# Patient Record
Sex: Male | Born: 1995 | Race: Black or African American | Hispanic: No | Marital: Single
Health system: Southern US, Community
[De-identification: ages and names within clinical notes are randomized; demographics above are authoritative.]

---

## 2017-07-29 ENCOUNTER — Emergency Department (HOSPITAL_COMMUNITY)
Admission: EM | Admit: 2017-07-29 | Discharge: 2017-07-29 | Disposition: A | Discharge status: Home / Self Care | Payer: Federal, State, Local not specified - PPO | Attending: Emergency Medicine | Admitting: Emergency Medicine

## 2017-07-29 ENCOUNTER — Encounter (HOSPITAL_COMMUNITY): Payer: Self-pay | Admitting: Nurse Practitioner

## 2017-07-29 ENCOUNTER — Emergency Department (HOSPITAL_COMMUNITY): Payer: Federal, State, Local not specified - PPO

## 2017-07-29 DIAGNOSIS — Y929 Unspecified place or not applicable: Secondary | ICD-10-CM | POA: Insufficient documentation

## 2017-07-29 DIAGNOSIS — Y9389 Activity, other specified: Secondary | ICD-10-CM | POA: Diagnosis not present

## 2017-07-29 DIAGNOSIS — X500XXA Overexertion from strenuous movement or load, initial encounter: Secondary | ICD-10-CM | POA: Insufficient documentation

## 2017-07-29 DIAGNOSIS — Y999 Unspecified external cause status: Secondary | ICD-10-CM | POA: Diagnosis not present

## 2017-07-29 DIAGNOSIS — S93104A Unspecified dislocation of right toe(s), initial encounter: Secondary | ICD-10-CM | POA: Insufficient documentation

## 2017-07-29 DIAGNOSIS — S92421A Displaced fracture of distal phalanx of right great toe, initial encounter for closed fracture: Secondary | ICD-10-CM | POA: Diagnosis not present

## 2017-07-29 DIAGNOSIS — S99921A Unspecified injury of right foot, initial encounter: Secondary | ICD-10-CM | POA: Diagnosis present

## 2017-07-29 MED ORDER — NAPROXEN 500 MG PO TABS: 500.0000 mg | ORAL_TABLET | Freq: Two times a day (BID) | ORAL | 0 refills | Status: AC

## 2017-07-29 MED ORDER — IBUPROFEN 200 MG PO TABS
600.0000 mg | ORAL_TABLET | Freq: Once | ORAL | Status: AC
  Administered 2017-07-29: 600 mg via ORAL
  Filled 2017-07-29: qty 3

## 2017-07-29 MED ORDER — HYDROCODONE-ACETAMINOPHEN 5-325 MG PO TABS
1.0000 | ORAL_TABLET | Freq: Once | ORAL | Status: AC
  Administered 2017-07-29: 1 via ORAL
  Filled 2017-07-29: qty 1

## 2017-07-29 NOTE — ED Provider Notes (Signed)
[facility]-EMERGENCY DEPT Provider Note   CSN: [id number] Arrival date & time: 07/29/17  1546     History   Chief Complaint Chief Complaint  Patient presents with  . Toe Pain    HPI Carlos Lee is a 22 y.o. male who presents to the ED with right toe pain. Patient reports he injured the toe while trying to do a back flip. The injury is to the right great toe.   The history is provided by the patient. No language interpreter was used.  Toe Pain  This is a new problem. The current episode started 1 to 2 hours ago. The problem occurs constantly. The problem has not changed since onset.The symptoms are aggravated by walking. Nothing relieves the symptoms. He has tried nothing for the symptoms.    History reviewed. No pertinent past medical history.  There are no active problems to display for this patient.   History reviewed. No pertinent surgical history.      Home Medications    Prior to Admission medications   Medication Sig Start Date End Date Taking? Authorizing Provider  naproxen (NAPROSYN) 500 MG tablet Take 1 tablet (500 mg total) by mouth 2 (two) times daily. 07/29/17   Janne Napoleon, NP    Family History No family history on file.  Social History Social History   Tobacco Use  . Smoking status: Not on file  Substance Use Topics  . Alcohol use: Yes  . Drug use: Not on file     Allergies   Patient has no allergy information on record.   Review of Systems Review of Systems  Musculoskeletal: Positive for arthralgias.       Right great toe  All other systems reviewed and are negative.    Physical Exam Updated Vital Signs BP 127/74 (BP Location: Left Arm)   Pulse 67   Temp 98.4 F (36.9 C) (Oral)   Resp 16   Ht  (1.88 m)   Wt 104.3 kg (230 lb)   SpO2 98%   BMI 29.53 kg/m   Physical Exam  Constitutional: He appears well-developed and well-nourished. No distress.  HENT:  Head: Normocephalic.  Eyes: EOM  are normal.  Neck: Neck supple.  Cardiovascular: Normal rate and intact distal pulses.  Pulmonary/Chest: Effort normal.  Musculoskeletal:       Right foot: There is tenderness and deformity. There is normal capillary refill and no laceration.  Right great toe with deformity.   Neurological: He is alert.  Skin: Skin is warm and dry.  Nursing note and vitals reviewed.    ED Treatments / Results  Labs (all labs ordered are listed, but only abnormal results are displayed) Labs Reviewed - No data to display  EKG None  Radiology Dg Toe Great Right  Result Date: 07/29/2017 CLINICAL DATA:  Post reduction EXAM: RIGHT GREAT TOE COMPARISON:  Right first toe radiographs from earlier today FINDINGS: Successful reduction, with no residual malalignment. Tiny avulsion fracture fragment is noted at the lateral base of the distal phalanx in the right first toe. No additional fracture. No suspicious focal osseous lesion. No radiopaque foreign body. IMPRESSION: Successful reduction, with no residual malalignment. Tiny avulsion fracture fragment at the lateral base of the distal phalanx in the right first toe. Electronically Signed   By: Delbert Phenix M.D.   On: 07/29/2017 17:24   Dg Toe Great Right  Result Date: 07/29/2017 CLINICAL DATA:  Right first toe pain after injury. EXAM: RIGHT GREAT  TOE COMPARISON:  None. FINDINGS: There is noted dorsal dislocation of the first distal phalanx relative to proximal phalanx. No definite fracture is noted. No soft tissue abnormality is noted. IMPRESSION: Dorsal dislocation of first distal phalanx relative to proximal phalanx. Electronically Signed   By: Lupita Raider, M.D.   On: 07/29/2017 16:47    Procedures Reduction of dislocation Date/Time: 07/29/2017 5:00 PM Performed by: Janne Napoleon, NP Authorized by: Janne Napoleon, NP  Consent: Verbal consent obtained. Consent given by: patient Patient understanding: patient states understanding of the procedure being  performed Test results: test results available and properly labeled Imaging studies: imaging studies available Required items: required blood products, implants, devices, and special equipment available Patient identity confirmed: verbally with patient Preparation: Patient was prepped and draped in the usual sterile fashion. Local anesthesia used: no  Anesthesia: Local anesthesia used: no  Sedation: Patient sedated: no  Patient tolerance: Patient tolerated the procedure well with no immediate complications Comments: Reduction of dislocation of right great toe    (including critical care time)  Medications Ordered in ED Medications  HYDROcodone-acetaminophen (NORCO/VICODIN) 5-325 MG per tablet 1 tablet (1 tablet Oral Given 07/29/17 1755)  ibuprofen (ADVIL,MOTRIN) tablet 600 mg (600 mg Oral Given 07/29/17 1755)     Initial Impression / Assessment and Plan / ED Course  I have reviewed the triage vital signs and the nursing notes. 22 y.o. male here with dislocation of the right great s/p injury stable for d/c after successful reduction. There is a tiny avulsion fracture noted on post reduction film. Buddy tape, post op shoe, ice, elevation and f/u with ortho. Patient agrees with plan.  Final Clinical Impressions(s) / ED Diagnoses   Final diagnoses:  Dislocated toe, right, initial encounter  Closed non-physeal fracture of distal phalanx of right great toe, initial encounter    ED Discharge Orders        Ordered    naproxen (NAPROSYN) 500 MG tablet  2 times daily     07/29/17 1734       Damian Leavell [place], [place] 07/29/17 1800    Vanetta Mulders, MD 07/31/17 [phone]

## 2017-07-29 NOTE — ED Triage Notes (Signed)
Pt is c/o right big toe pain. States he injured it while trying to do a back flip.

## 2017-07-29 NOTE — Discharge Instructions (Signed)
Buddy tape the toes and wear the post op shoe. Follow up with Dr. Roda Shutters.

## 2019-02-11 IMAGING — CR DG TOE GREAT 2+V*R*
3 series · 3 of 3 positions shown · non-contrast
Comparison: None.

CLINICAL DATA: Right first toe pain after injury.

EXAM:
RIGHT GREAT TOE

[x toes ap right]
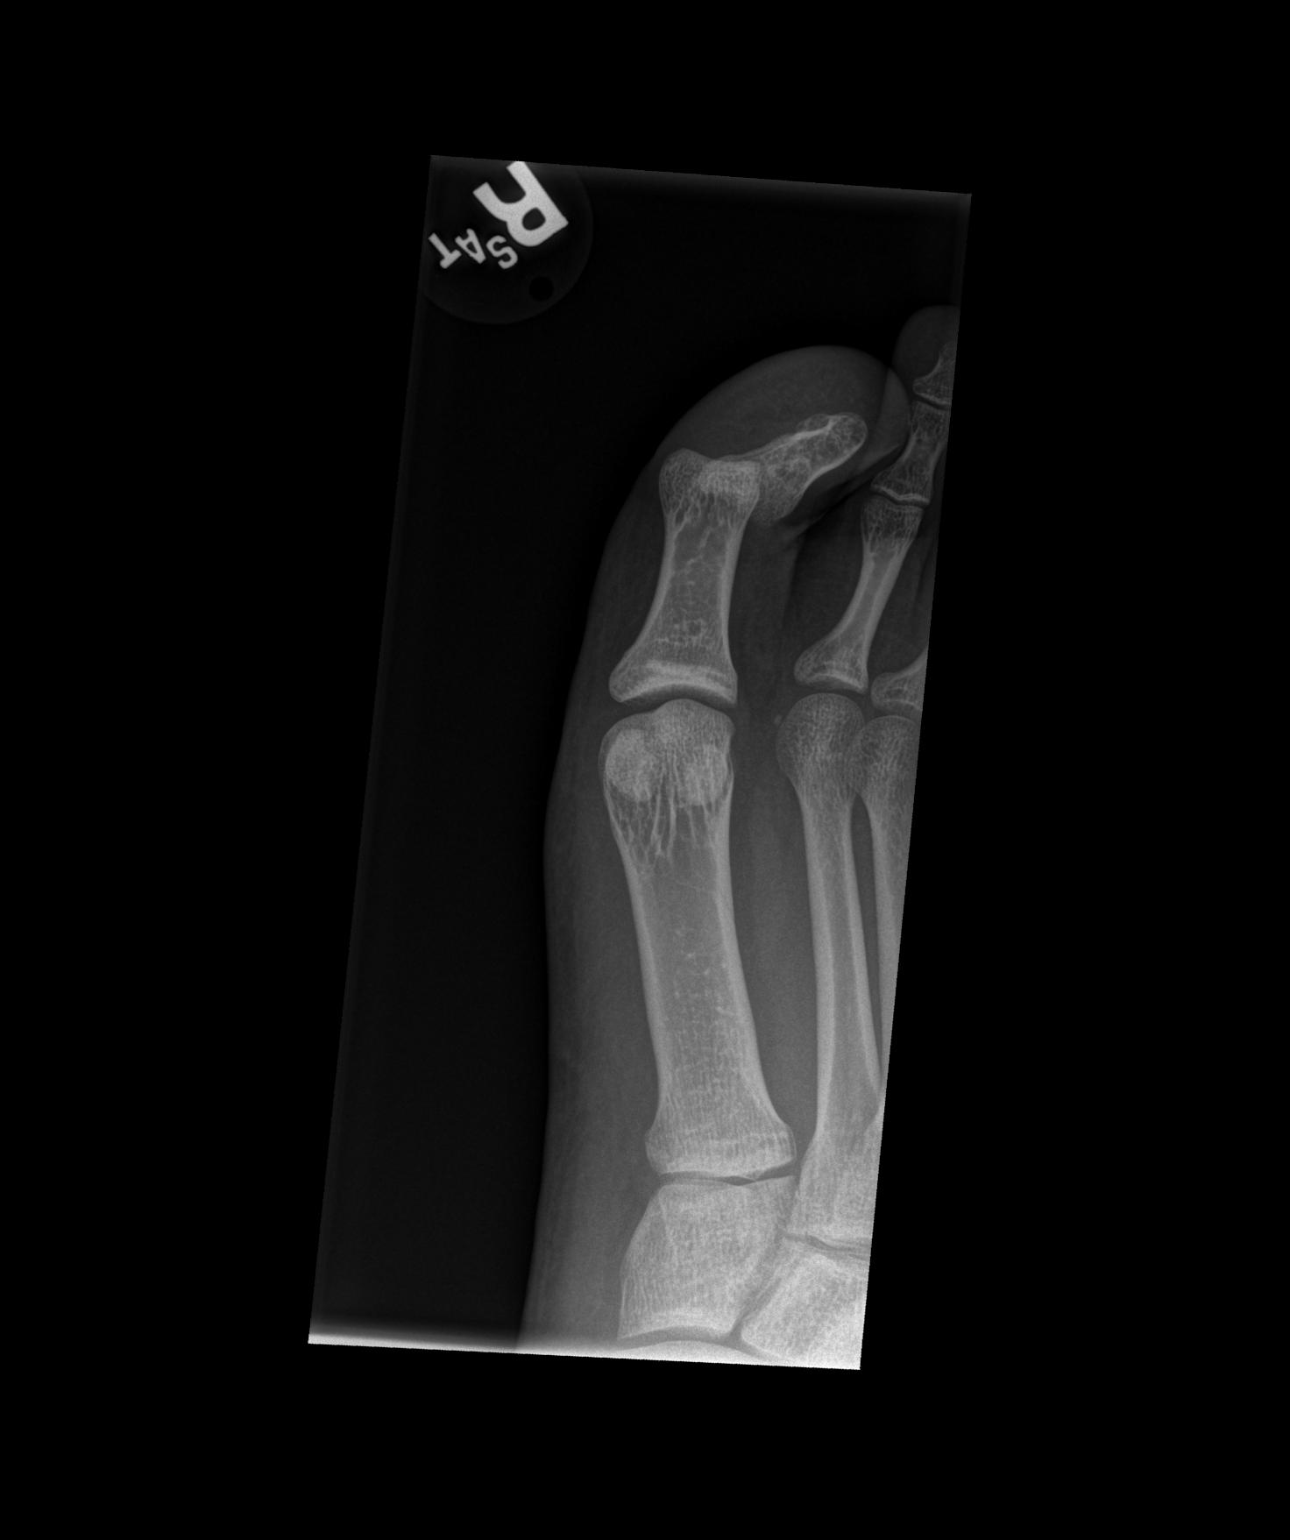

[x toes obl right]
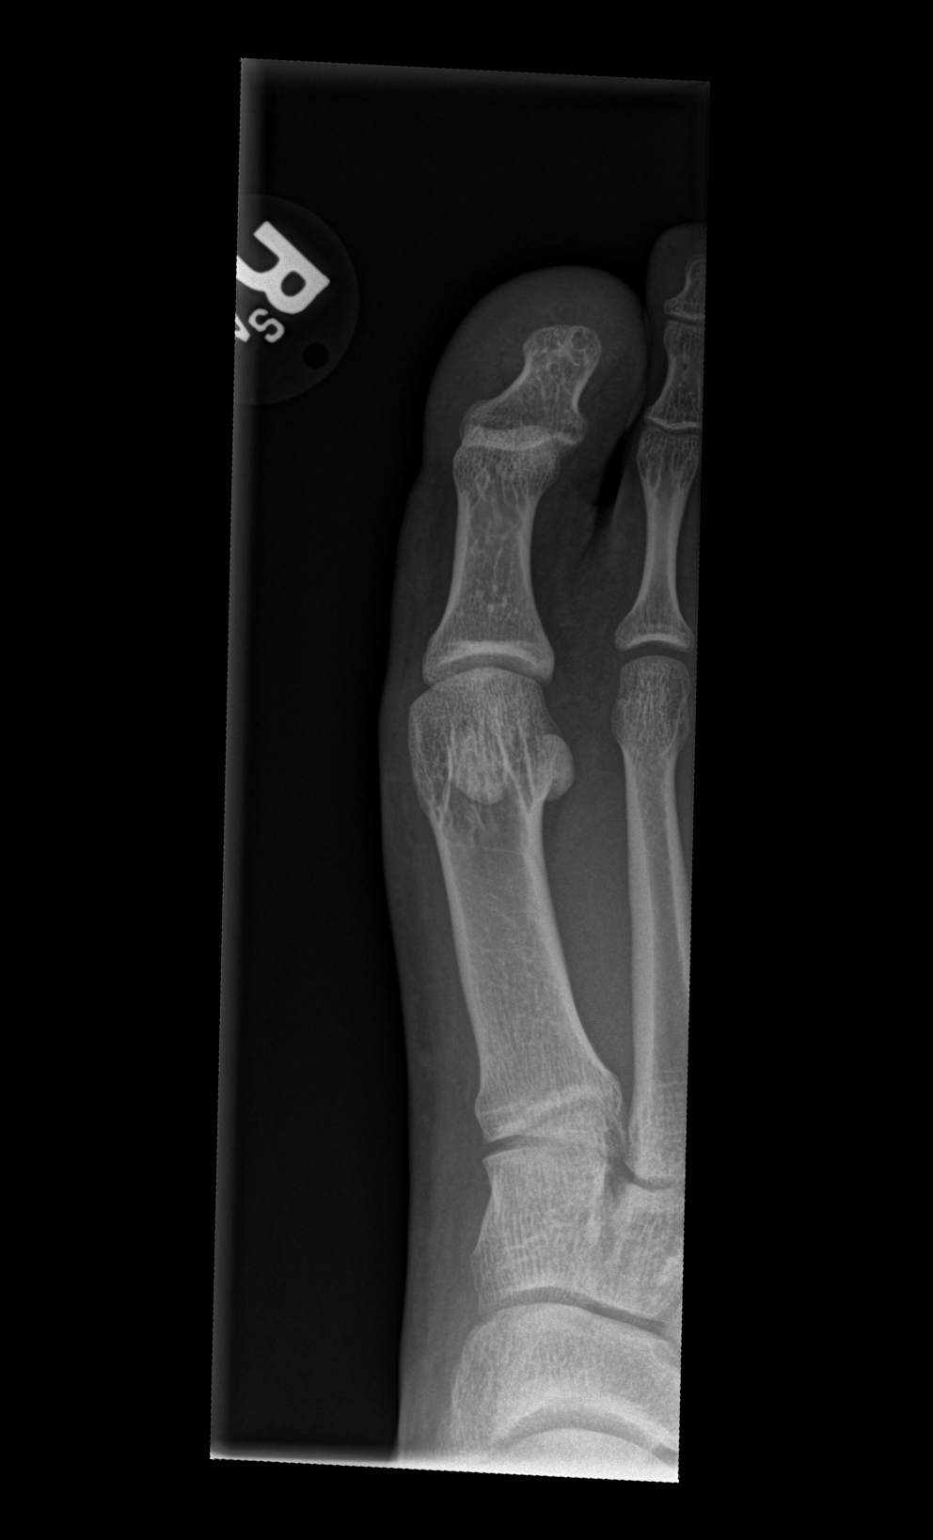

[x toes lat right]
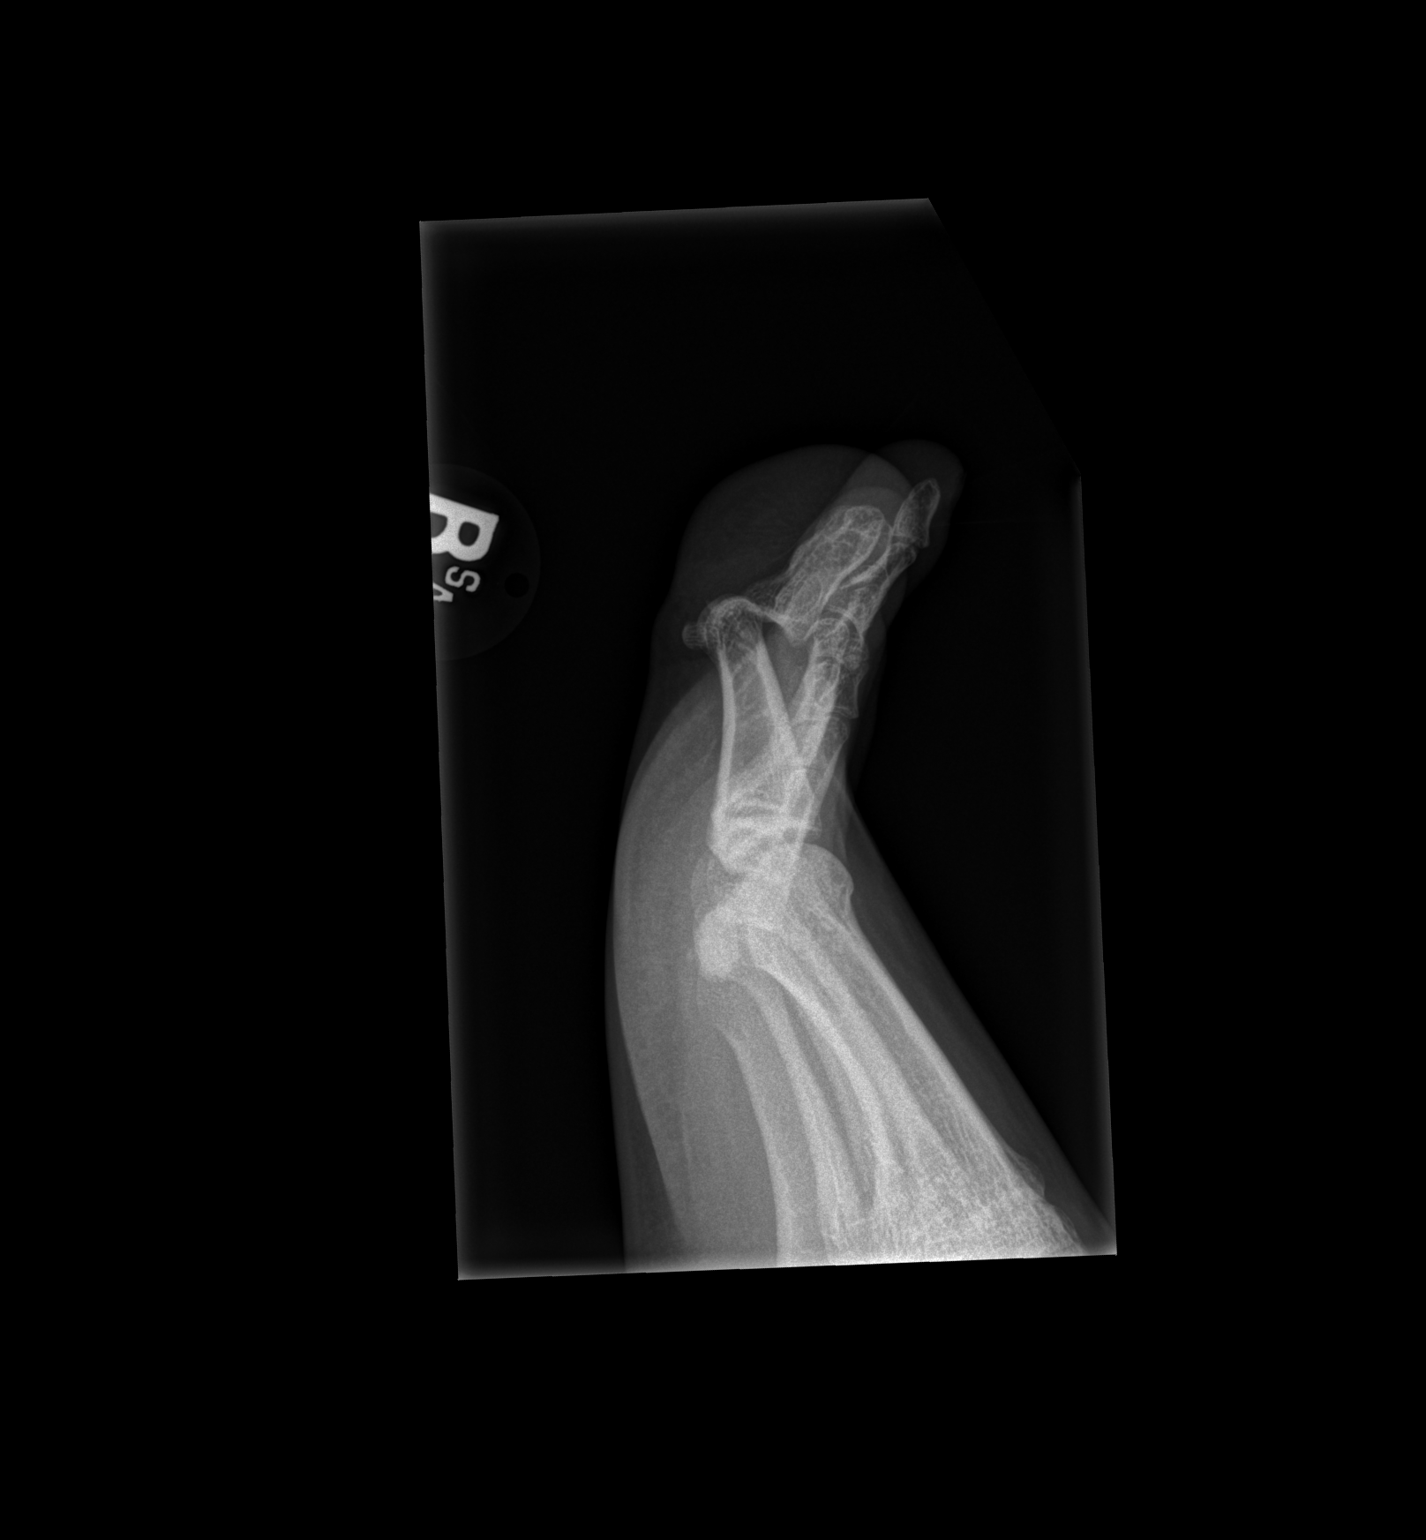

[3 of 3 positions shown; findings below may reference images not displayed]

FINDINGS: There is noted dorsal dislocation of the first distal phalanx
relative to proximal phalanx. No definite fracture is noted. No soft
tissue abnormality is noted.
IMPRESSION: Dorsal dislocation of first distal phalanx relative to proximal
phalanx.

## 2019-06-13 ENCOUNTER — Ambulatory Visit: Payer: Federal, State, Local not specified - PPO | Attending: Family

## 2019-06-13 DIAGNOSIS — Z23 Encounter for immunization: Secondary | ICD-10-CM

## 2019-06-13 NOTE — Progress Notes (Signed)
   Covid-19 Vaccination Clinic  Name:  Shervin Cypert    MRN: [id number] DOB: 16-Jun-1995  06/13/2019  Mr. Henson was observed post Covid-19 immunization for 15 minutes without incident. He was provided with Vaccine Information Sheet and instruction to access the V-Safe system.   Mr. Treinen was instructed to call 911 with any severe reactions post vaccine: Marland Kitchen Difficulty breathing  . Swelling of face and throat  . A fast heartbeat  . A bad rash all over body  . Dizziness and weakness   Immunizations Administered    Name Date Dose VIS Date Route   Moderna COVID-19 Vaccine 06/13/2019  3:51 PM 0.5 mL 02/26/2019 Intramuscular   Manufacturer: Moderna   Lot: [id number]   NDC: 18841-660-63

## 2019-07-09 ENCOUNTER — Ambulatory Visit: Payer: Federal, State, Local not specified - PPO | Attending: Family

## 2019-07-09 DIAGNOSIS — Z23 Encounter for immunization: Secondary | ICD-10-CM

## 2019-07-09 NOTE — Progress Notes (Signed)
   Covid-19 Vaccination Clinic  Name:  Carlos Lee    MRN: [id number] DOB: 04-07-1995  07/09/2019  Mr. [name] was observed post Covid-19 immunization for 15 minutes without incident. He was provided with Vaccine Information Sheet and instruction to access the V-Safe system.   Mr. [name] was instructed to call 911 with any severe reactions post vaccine: Marland Kitchen Difficulty breathing  . Swelling of face and throat  . A fast heartbeat  . A bad rash all over body  . Dizziness and weakness   Immunizations Administered    Name Date Dose VIS Date Route   Moderna COVID-19 Vaccine 07/09/2019  4:22 PM 0.5 mL 02/26/2019 Intramuscular   Manufacturer: Moderna   Lot: [id number]   NDC: 40698-614-83

## 2019-07-16 ENCOUNTER — Ambulatory Visit: Payer: Federal, State, Local not specified - PPO
